# Patient Record
Sex: Female | Born: 1935 | Marital: Single | Smoking: Former smoker
Health system: Southern US, Community
[De-identification: ages and names within clinical notes are randomized; demographics above are authoritative.]

## PROBLEM LIST (undated history)

## (undated) DIAGNOSIS — E119 Type 2 diabetes mellitus without complications: Secondary | ICD-10-CM

## (undated) DIAGNOSIS — F0280 Dementia in other diseases classified elsewhere without behavioral disturbance: Secondary | ICD-10-CM

## (undated) DIAGNOSIS — E079 Disorder of thyroid, unspecified: Secondary | ICD-10-CM

## (undated) DIAGNOSIS — E538 Deficiency of other specified B group vitamins: Secondary | ICD-10-CM

## (undated) DIAGNOSIS — I10 Essential (primary) hypertension: Secondary | ICD-10-CM

## (undated) DIAGNOSIS — C50919 Malignant neoplasm of unspecified site of unspecified female breast: Secondary | ICD-10-CM

## (undated) DIAGNOSIS — G4700 Insomnia, unspecified: Secondary | ICD-10-CM

## (undated) DIAGNOSIS — E46 Unspecified protein-calorie malnutrition: Secondary | ICD-10-CM

## (undated) DIAGNOSIS — E785 Hyperlipidemia, unspecified: Secondary | ICD-10-CM

## (undated) DIAGNOSIS — G609 Hereditary and idiopathic neuropathy, unspecified: Secondary | ICD-10-CM

## (undated) HISTORY — PX: APPENDECTOMY: SHX54

## (undated) HISTORY — DX: Hyperlipidemia, unspecified: E78.5

## (undated) HISTORY — PX: MASTECTOMY: SHX3

## (undated) HISTORY — DX: Type 2 diabetes mellitus without complications: E11.9

## (undated) HISTORY — DX: Dementia in other diseases classified elsewhere, unspecified severity, without behavioral disturbance, psychotic disturbance, mood disturbance, and anxiety: F02.80

## (undated) HISTORY — DX: Essential (primary) hypertension: I10

## (undated) HISTORY — PX: OTHER SURGICAL HISTORY: SHX169

## (undated) HISTORY — DX: Unspecified protein-calorie malnutrition: E46

## (undated) HISTORY — DX: Hereditary and idiopathic neuropathy, unspecified: G60.9

## (undated) HISTORY — DX: Malignant neoplasm of unspecified site of unspecified female breast: C50.919

## (undated) HISTORY — PX: BREAST SURGERY: SHX581

## (undated) HISTORY — PX: TONSILLECTOMY: SUR1361

## (undated) HISTORY — DX: Deficiency of other specified B group vitamins: E53.8

## (undated) HISTORY — DX: Disorder of thyroid, unspecified: E07.9

## (undated) HISTORY — DX: Insomnia, unspecified: G47.00

---

## 2002-10-26 DIAGNOSIS — C50919 Malignant neoplasm of unspecified site of unspecified female breast: Secondary | ICD-10-CM | POA: Insufficient documentation

## 2002-10-26 HISTORY — DX: Malignant neoplasm of unspecified site of unspecified female breast: C50.919

## 2004-04-28 ENCOUNTER — Ambulatory Visit: Payer: Self-pay | Admitting: Internal Medicine

## 2004-05-27 ENCOUNTER — Ambulatory Visit: Payer: Self-pay | Admitting: Internal Medicine

## 2004-09-27 ENCOUNTER — Ambulatory Visit: Payer: Self-pay | Admitting: General Surgery

## 2005-04-27 ENCOUNTER — Ambulatory Visit: Payer: Self-pay | Admitting: Internal Medicine

## 2005-05-27 ENCOUNTER — Ambulatory Visit: Payer: Self-pay | Admitting: Internal Medicine

## 2005-07-26 ENCOUNTER — Ambulatory Visit: Payer: Self-pay | Admitting: Internal Medicine

## 2005-07-28 ENCOUNTER — Ambulatory Visit: Payer: Self-pay | Admitting: Internal Medicine

## 2005-10-28 ENCOUNTER — Ambulatory Visit: Payer: Self-pay | Admitting: General Surgery

## 2006-11-27 ENCOUNTER — Ambulatory Visit: Payer: Self-pay | Admitting: General Surgery

## 2007-11-26 ENCOUNTER — Ambulatory Visit: Payer: Self-pay | Admitting: Internal Medicine

## 2007-11-26 ENCOUNTER — Ambulatory Visit: Payer: Self-pay | Admitting: General Surgery

## 2007-12-26 ENCOUNTER — Ambulatory Visit: Payer: Self-pay | Admitting: Internal Medicine

## 2008-01-10 ENCOUNTER — Ambulatory Visit: Payer: Self-pay | Admitting: Internal Medicine

## 2008-01-26 ENCOUNTER — Ambulatory Visit: Payer: Self-pay | Admitting: Internal Medicine

## 2008-02-26 ENCOUNTER — Ambulatory Visit: Payer: Self-pay | Admitting: Internal Medicine

## 2008-03-27 ENCOUNTER — Ambulatory Visit: Payer: Self-pay | Admitting: Internal Medicine

## 2008-04-27 ENCOUNTER — Ambulatory Visit: Payer: Self-pay | Admitting: Internal Medicine

## 2008-05-27 ENCOUNTER — Ambulatory Visit: Payer: Self-pay | Admitting: Internal Medicine

## 2008-06-27 ENCOUNTER — Ambulatory Visit: Payer: Self-pay | Admitting: Internal Medicine

## 2008-07-01 ENCOUNTER — Ambulatory Visit: Payer: Self-pay | Admitting: Internal Medicine

## 2008-07-28 ENCOUNTER — Ambulatory Visit: Payer: Self-pay | Admitting: Internal Medicine

## 2008-08-25 ENCOUNTER — Ambulatory Visit: Payer: Self-pay | Admitting: Internal Medicine

## 2008-09-20 IMAGING — CT CT GUIDANCE PLACEMENT RAD THERAPY FIELDS
1 series · 16 of 32 positions shown, 20 images · non-contrast
Comparison: none

[Series 2: tx planning · axial · 0.98mm/px · z∈[-152,+162]mm · 16 of 141 slices shown, 20 images]
[im 10/141  soft-tissue]
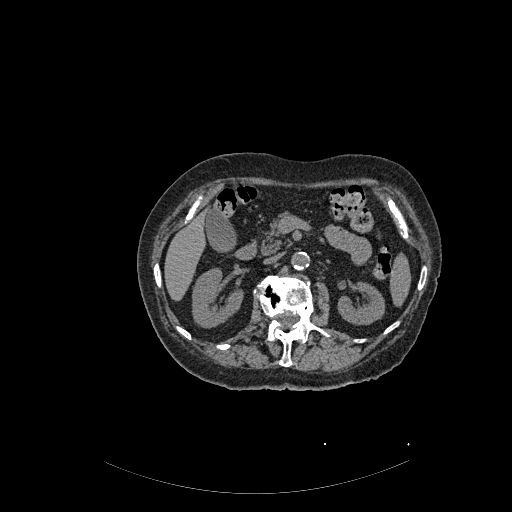
[im 10/141  bone]
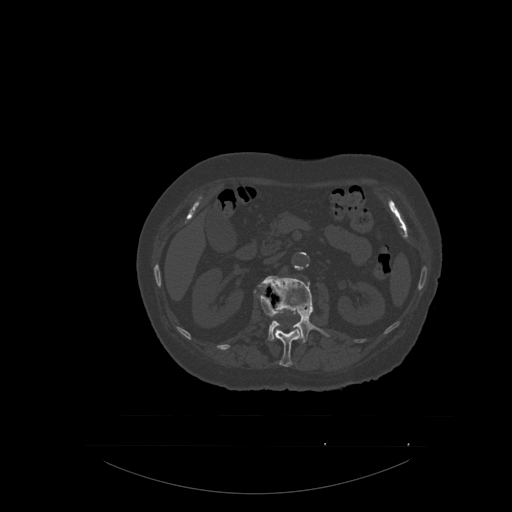
[im 19/141  soft-tissue]
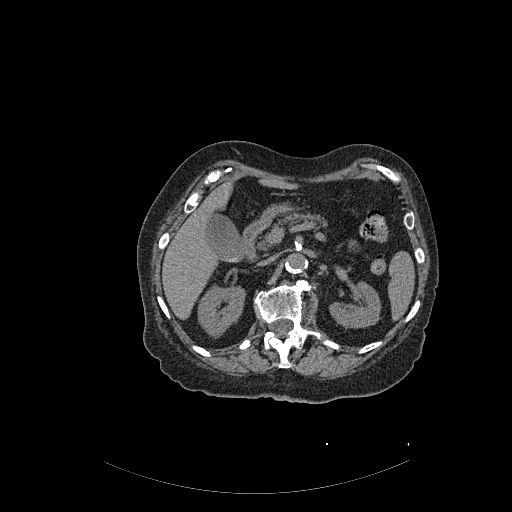
[im 28/141  soft-tissue]
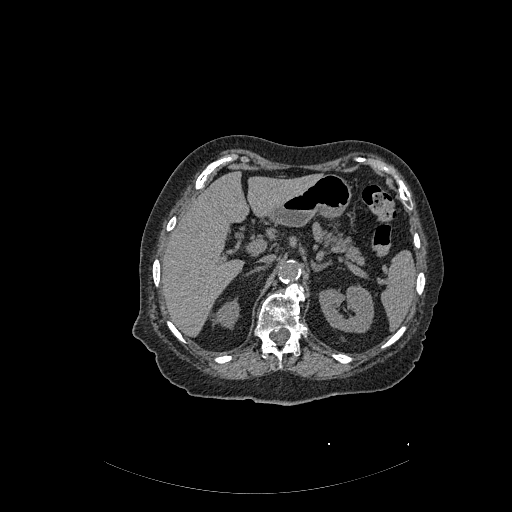
[im 37/141  soft-tissue]
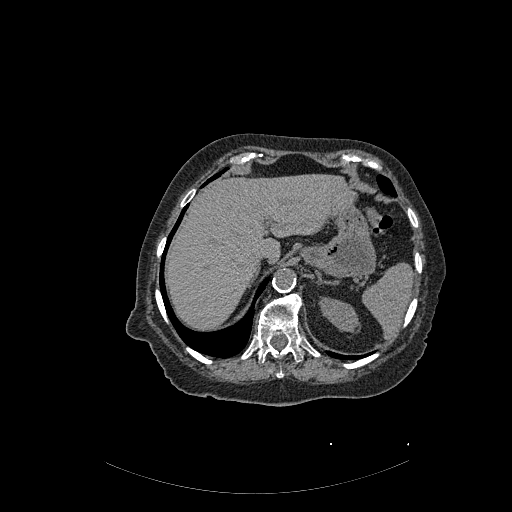
[im 46/141  soft-tissue]
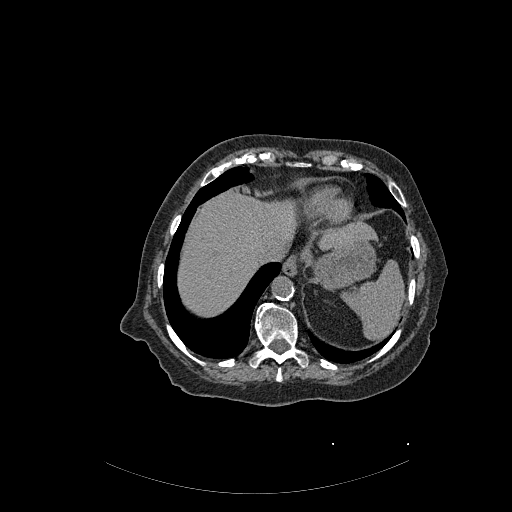
[im 55/141  soft-tissue]
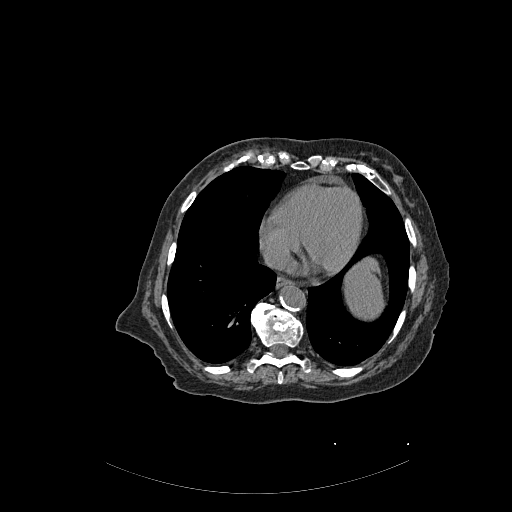
[im 64/141  soft-tissue]
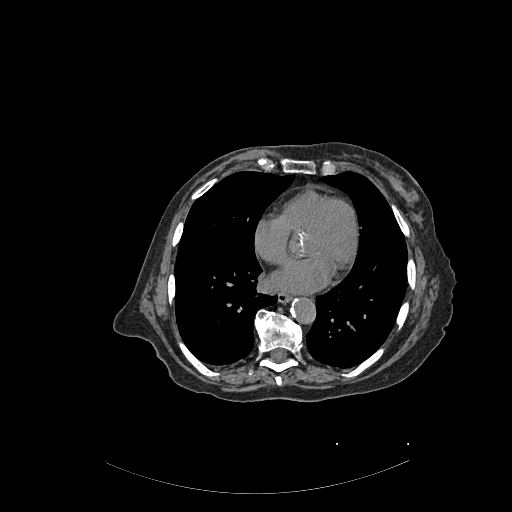
[im 77/141  soft-tissue]
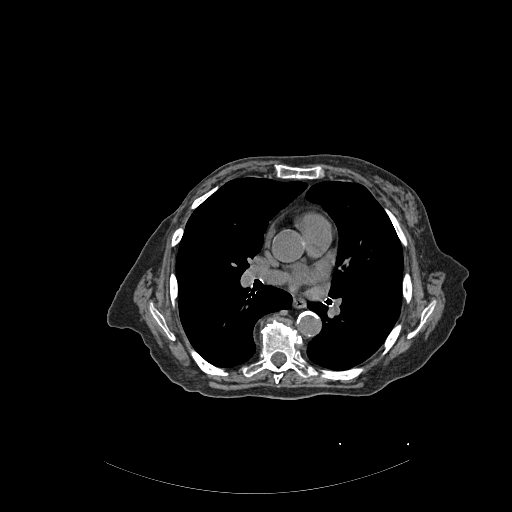
[im 86/141  soft-tissue]
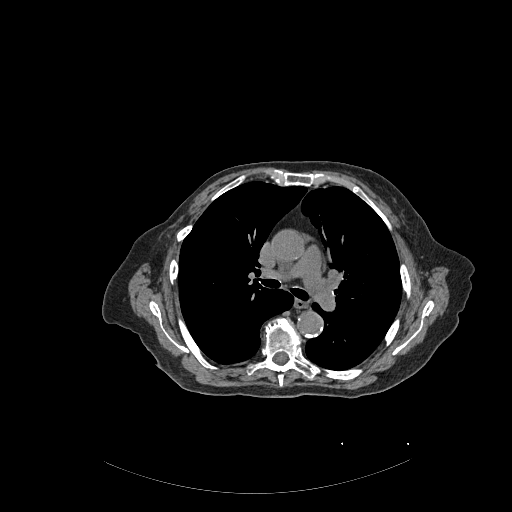
[im 86/141  bone]
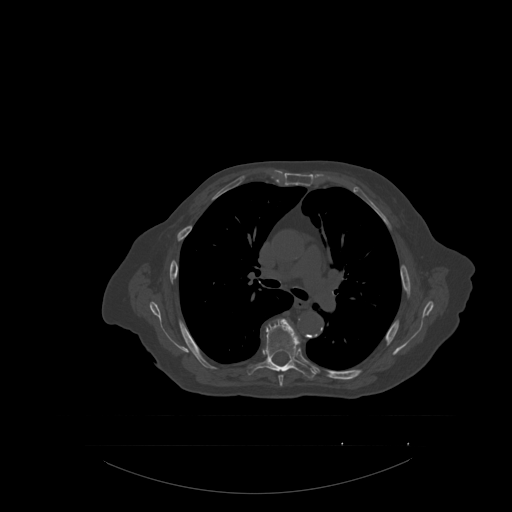
[im 95/141  soft-tissue]
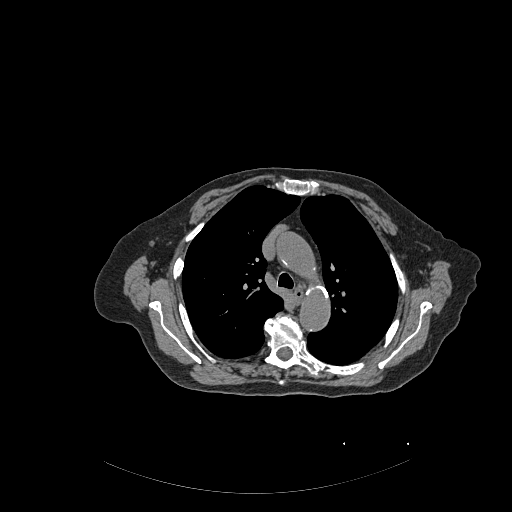
[im 104/141  soft-tissue]
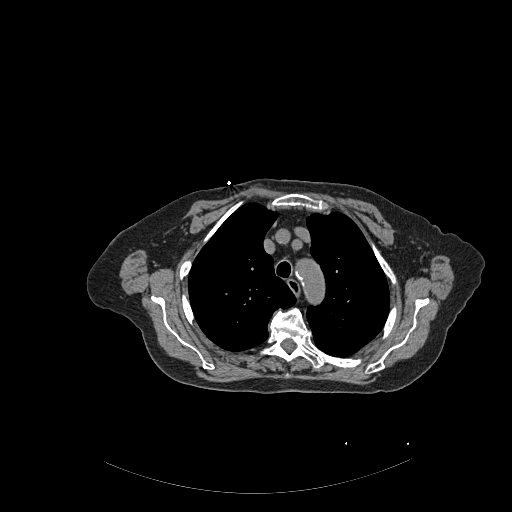
[im 113/141  soft-tissue]
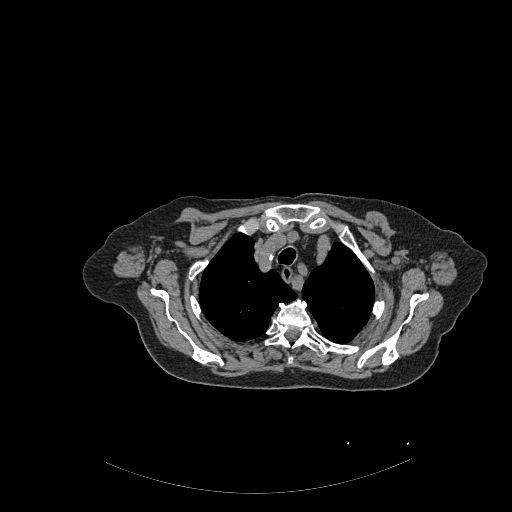
[im 122/141  soft-tissue]
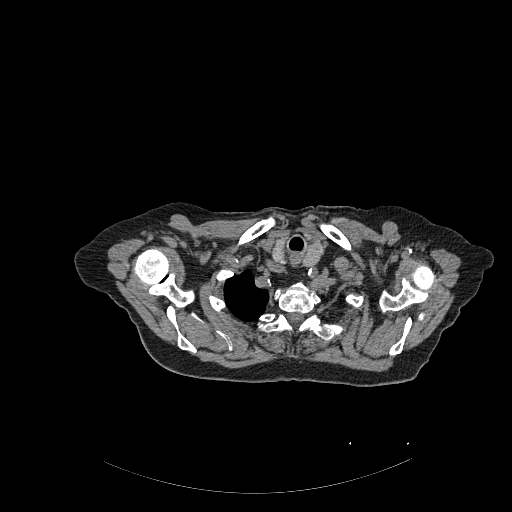
[im 122/141  lung]
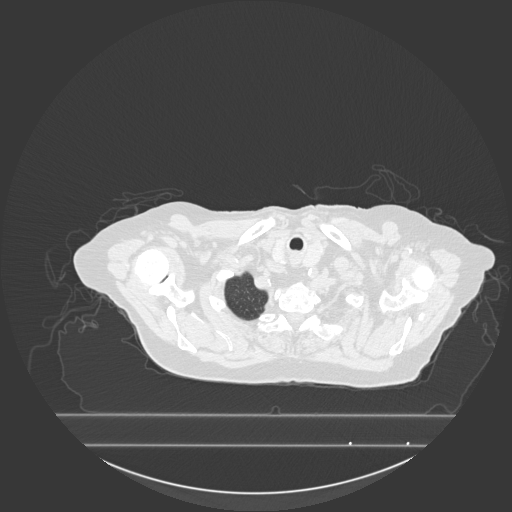
[im 127/141  lung]
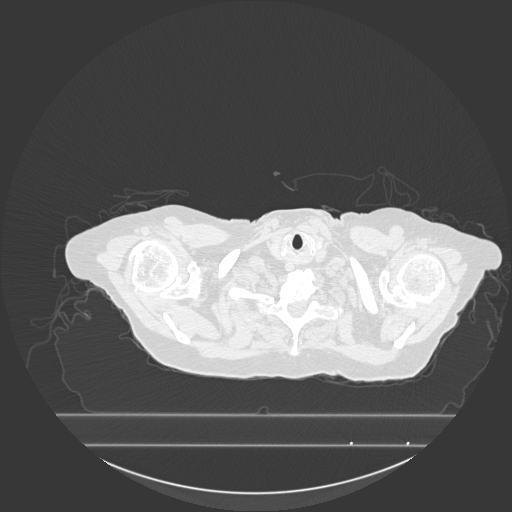
[im 131/141  soft-tissue]
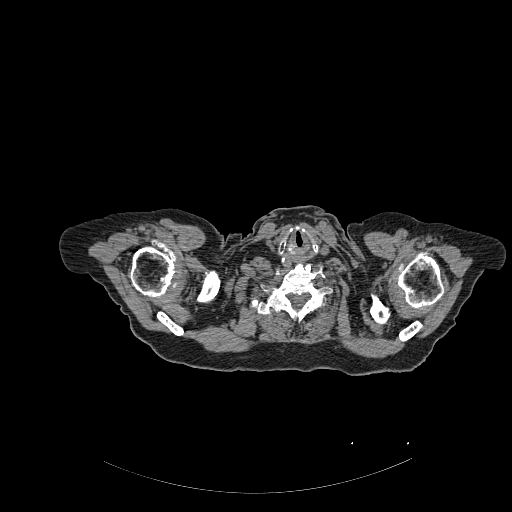
[im 131/141  lung]
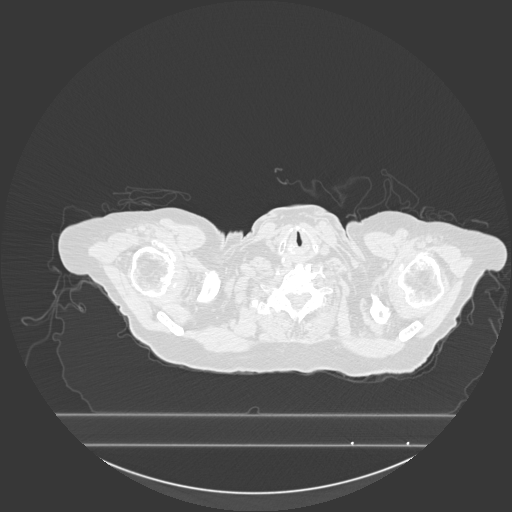
[im 136/141  lung]
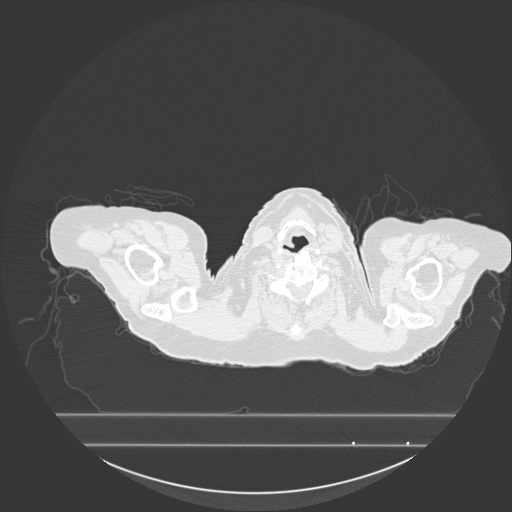

[16 of 32 positions shown; findings below may reference images not displayed]

IMAGES IMPORTED FROM THE SYNGO WORKFLOW SYSTEM
NO DICTATION FOR STUDY

## 2008-09-25 ENCOUNTER — Ambulatory Visit: Payer: Self-pay | Admitting: Internal Medicine

## 2008-10-06 ENCOUNTER — Ambulatory Visit: Payer: Self-pay | Admitting: Internal Medicine

## 2008-10-25 ENCOUNTER — Ambulatory Visit: Payer: Self-pay | Admitting: Internal Medicine

## 2008-11-25 ENCOUNTER — Ambulatory Visit: Payer: Self-pay | Admitting: Internal Medicine

## 2008-12-22 ENCOUNTER — Ambulatory Visit: Payer: Self-pay | Admitting: Internal Medicine

## 2008-12-22 ENCOUNTER — Ambulatory Visit: Payer: Self-pay | Admitting: General Surgery

## 2008-12-25 ENCOUNTER — Ambulatory Visit: Payer: Self-pay | Admitting: Internal Medicine

## 2009-12-29 ENCOUNTER — Ambulatory Visit: Payer: Self-pay | Admitting: General Surgery

## 2011-07-12 ENCOUNTER — Encounter: Payer: Self-pay | Admitting: Nurse Practitioner

## 2011-07-12 ENCOUNTER — Encounter: Payer: Self-pay | Admitting: Cardiothoracic Surgery

## 2011-07-15 ENCOUNTER — Encounter: Payer: Self-pay | Admitting: Internal Medicine

## 2011-07-15 ENCOUNTER — Ambulatory Visit: Payer: Self-pay | Admitting: Internal Medicine

## 2011-07-15 ENCOUNTER — Ambulatory Visit (INDEPENDENT_AMBULATORY_CARE_PROVIDER_SITE_OTHER): Payer: 59 | Admitting: Internal Medicine

## 2011-07-15 VITALS — BP 100/50 | HR 92 | Temp 97.4°F | Wt 100.0 lb

## 2011-07-15 DIAGNOSIS — E039 Hypothyroidism, unspecified: Secondary | ICD-10-CM

## 2011-07-15 DIAGNOSIS — E559 Vitamin D deficiency, unspecified: Secondary | ICD-10-CM

## 2011-07-15 DIAGNOSIS — R634 Abnormal weight loss: Secondary | ICD-10-CM | POA: Insufficient documentation

## 2011-07-15 DIAGNOSIS — E119 Type 2 diabetes mellitus without complications: Secondary | ICD-10-CM

## 2011-07-15 DIAGNOSIS — F0390 Unspecified dementia without behavioral disturbance: Secondary | ICD-10-CM | POA: Insufficient documentation

## 2011-07-15 DIAGNOSIS — E46 Unspecified protein-calorie malnutrition: Secondary | ICD-10-CM

## 2011-07-15 DIAGNOSIS — N390 Urinary tract infection, site not specified: Secondary | ICD-10-CM

## 2011-07-15 LAB — POCT URINALYSIS DIPSTICK
Glucose, UA: NEGATIVE
Nitrite, UA: POSITIVE
Spec Grav, UA: 1.015
Urobilinogen, UA: 1

## 2011-07-15 NOTE — Assessment & Plan Note (Addendum)
By UA today, but has been taking keflex since W[place]day for wound infection.  Will send for culture and if resistant to keflex we will call an alternative abx.

## 2011-07-15 NOTE — Progress Notes (Signed)
Subjective:    Patient ID: Sophia Rice, female    DOB: 11-06-1923, 78 y.o.   MRN: [id number]  HPI  Ms. Camposano is an 79 yo white female with a history of diet controlled diabetes, breast cancer diagnosed and treated in 2004  And mild dementia who is brought in by her nephew for her annual visit.  Patient refuses quarterly care and lives alone with family nearby providing meals.  Her chief complaint is weight loss.  She does not prepare her own meals,  States that her appetite is "so so", and per family has not been bathing or etaing ulness family stays with her.  She is a recluse and prefers to be left alone.  She denies pain, shortness of breath and trouble sleeping.    Past Medical History  Diagnosis Date  . Hypertension   . Diabetes mellitus type II   . Breast cancer 10/2002    right. Recurrence treated with XRT 2009  . Hyperlipidemia   . Unspecified hereditary and idiopathic peripheral neuropathy   . Dementia in conditions classified elsewhere without behavioral disturbance   . Persistent insomnia   . Other B-complex deficiencies   . Other protein-calorie malnutrition     No current outpatient prescriptions on file prior to visit.     Review of Systems  Constitutional: Negative for fever, chills and unexpected weight change.  HENT: Negative for hearing loss, ear pain, nosebleeds, congestion, sore throat, facial swelling, rhinorrhea, sneezing, mouth sores, trouble swallowing, neck pain, neck stiffness, voice change, postnasal drip, sinus pressure, tinnitus and ear discharge.   Eyes: Negative for pain, discharge, redness and visual disturbance.  Respiratory: Negative for cough, chest tightness, shortness of breath, wheezing and stridor.   Cardiovascular: Negative for chest pain, palpitations and leg swelling.  Musculoskeletal: Negative for myalgias and arthralgias.  Skin: Negative for color change and rash.  Neurological: Negative for dizziness, weakness, light-headedness  and headaches.  Hematological: Negative for adenopathy.   BP 100/50  Pulse 92  Temp(Src) 97.4 F (36.3 C) (Oral)  Wt 100 lb (45.36 kg)    Objective:   Physical Exam  Vitals reviewed. Constitutional: She is oriented to person, place, and time. She appears well-developed.       Unkempt, hair is dirty  HENT:  Mouth/Throat: Oropharynx is clear and moist.  Eyes: EOM are normal. Pupils are equal, round, and reactive to light. No scleral icterus.  Neck: Normal range of motion. Neck supple. No JVD present. No thyromegaly present.  Cardiovascular: Normal rate, regular rhythm, normal heart sounds and intact distal pulses.   Pulmonary/Chest: Effort normal and breath sounds normal.  Abdominal: Soft. Bowel sounds are normal. She exhibits no mass. There is no tenderness.  Musculoskeletal: Normal range of motion. She exhibits no edema.  Lymphadenopathy:    She has no cervical adenopathy.  Neurological: She is alert and oriented to person, place, and time. No cranial nerve deficit or sensory deficit. She displays a negative Romberg sign. Gait abnormal.       Gait is shuffling, short steps She cannot rise from chair without assistance  Skin: Skin is warm and dry.  Psychiatric: She has a normal mood and affect.       Assessment & Plan:   UTI (lower urinary tract infection) By UA today, but has been taking keflex since Wenesday for wound infection.  Will send for culture and if resistant to keflex we will call an alternative abx.   Dementia She maintains orientation to day and year but  has difficulyt with recall, calculations and abstract pictures (could not interpret or draw a clock face with the time 11:10).  She is not bathing and eating .  Her nephew has POA and we discussed the eventual, inevitable need for placement in assisted living [facility] care facility.  Family would like to keep her in her home if she will accept a paid caregiver coming in  twice weekly to help with bathing, and they  have been given the contact information for Meals on Wheels with Ensure /Boost supplements to be supplied by family .  Weight loss She has lost two lbs in the last six months by review of prior office's  records. Albumin is normal.  Prior work up for occult malignancy  in 2011 included a CT of chest, abdomen and pelvis which was normal.  She refused colonoscopy.  Wt loss has been attributed to dementia.  Recommended nutrtional supplements to family (Ensure, Boost, etc) once or two daily in addition to meals.  Her alk phos is elevated and may suggest cholelithiasis.  Will recommend abdominal ultrasound.   Diabetes mellitus, controlled Historically diet controlled. She has not had a hgba1c in over 6 months.  a1c was 6.0 today.  She could not provide enough urine for a , culture and  microalb/cr ratio so one was not done.   Hypothyroidism New diagnosis, with TSH > 20 today.  Starting low dose thyroid.  Repeat TSH needed in 6 weeks    Updated Medication List Outpatient Encounter Prescriptions as of 07/15/2011  Medication Sig Dispense Refill  . calcium-vitamin D (OSCAL WITH D) 500-200 MG-UNIT per tablet Take 1 tablet by mouth daily.      . cholecalciferol (VITAMIN D) 1000 UNITS tablet Take 1,000 Units by mouth daily.      Marland Kitchen zolpidem (AMBIEN) 5 MG tablet Take 5 mg by mouth at bedtime as needed.      . ergocalciferol (DRISDOL) 50000 UNITS capsule Take 1 capsule (50,000 Units total) by mouth once a week.  4 capsule  2  . levothyroxine (SYNTHROID, LEVOTHROID) 50 MCG tablet Take 1 tablet (50 mcg total) by mouth daily.  90 tablet  3

## 2011-07-15 NOTE — Patient Instructions (Addendum)
I want you to drink at least one Boost or Ensure can per day in addition to your meals so you don't lose any more weight .  You need to have a bath twice a week at least,  To keep clean . A nurse will be coming to the  House to help you with this.     You need to use walker every time you leave the house and inside the house.     You should never take your Life Alert off

## 2011-07-16 LAB — HEMOGLOBIN A1C
Hgb A1c MFr Bld: 6 % — ABNORMAL HIGH (ref <5.7)
Mean Plasma Glucose: 126 mg/dL — ABNORMAL HIGH (ref <117)

## 2011-07-16 LAB — VITAMIN D 25 HYDROXY (VIT D DEFICIENCY, FRACTURES): Vit D, 25-Hydroxy: 19 ng/mL — ABNORMAL LOW (ref 30–89)

## 2011-07-16 LAB — COMPREHENSIVE METABOLIC PANEL
ALT: 10 U/L (ref 0–35)
AST: 31 U/L (ref 0–37)
Albumin: 3.7 g/dL (ref 3.5–5.2)
Alkaline Phosphatase: 206 U/L — ABNORMAL HIGH (ref 39–117)
BUN: 18 mg/dL (ref 6–23)
CO2: 20 mEq/L (ref 19–32)
Calcium: 9 mg/dL (ref 8.4–10.5)
Chloride: 97 mEq/L (ref 96–112)
Creat: 0.7 mg/dL (ref 0.50–1.10)
Glucose, Bld: 106 mg/dL — ABNORMAL HIGH (ref 70–99)
Potassium: 4.4 mEq/L (ref 3.5–5.3)
Sodium: 134 mEq/L — ABNORMAL LOW (ref 135–145)
Total Bilirubin: 0.7 mg/dL (ref 0.3–1.2)
Total Protein: 5.4 g/dL — ABNORMAL LOW (ref 6.0–8.3)

## 2011-07-16 LAB — TSH: TSH: 27.019 u[IU]/mL — ABNORMAL HIGH (ref 0.350–4.500)

## 2011-07-16 MED ORDER — LEVOTHYROXINE SODIUM 50 MCG PO TABS: 50.0000 ug | ORAL_TABLET | Freq: Every day | ORAL | Status: AC

## 2011-07-16 MED ORDER — ERGOCALCIFEROL 1.25 MG (50000 UT) PO CAPS: 50000.0000 [IU] | ORAL_CAPSULE | ORAL | Status: AC

## 2011-07-17 ENCOUNTER — Encounter: Payer: Self-pay | Admitting: Internal Medicine

## 2011-07-17 DIAGNOSIS — E1143 Type 2 diabetes mellitus with diabetic autonomic (poly)neuropathy: Secondary | ICD-10-CM | POA: Insufficient documentation

## 2011-07-17 DIAGNOSIS — E039 Hypothyroidism, unspecified: Secondary | ICD-10-CM | POA: Insufficient documentation

## 2011-07-17 NOTE — Assessment & Plan Note (Addendum)
She has lost two lbs in the last six months by review of prior office's  records. Albumin is normal.  Prior work up for occult malignancy  in 2011 included a CT of chest, abdomen and pelvis which was normal.  She refused colonoscopy.  Wt loss has been attributed to dementia.  Recommended nutrtional supplements to family (Ensure, Boost, etc) once or two daily in addition to meals.  Her alk phos is elevated and may suggest cholelithiasis.  Will recommend abdominal ultrasound.

## 2011-07-17 NOTE — Assessment & Plan Note (Signed)
New diagnosis, with TSH > 20 today.  Starting low dose thyroid.  Repeat TSH needed in 6 weeks

## 2011-07-17 NOTE — Assessment & Plan Note (Signed)
She maintains orientation to day and year but has difficulyt with recall, calculations and abstract pictures (could not interpret or draw a clock face with the time 11:10).  She is not bathing and eating .  Her nephew has POA and we discussed the eventual, inevitable need for placement in assisted living [facility] care facility.  Family would like to keep her in her home if she will accept a paid caregiver coming in  twice weekly to help with bathing, and they have been given the contact information for Meals on Wheels with Ensure /Boost supplements to be supplied by family .

## 2011-07-17 NOTE — Assessment & Plan Note (Addendum)
Historically diet controlled. She has not had a hgba1c in over 6 months.  a1c was 6.0 today.  She could not provide enough urine for a , culture and  microalb/cr ratio so one was not done.

## 2011-07-18 LAB — MICROALBUMIN / CREATININE URINE RATIO

## 2011-07-18 LAB — URINE CULTURE: Colony Count: >=100000

## 2011-07-21 ENCOUNTER — Encounter: Payer: Self-pay | Admitting: Internal Medicine

## 2011-07-22 NOTE — Telephone Encounter (Signed)
Pt's niece advised of lab results, meds called to [facility].

## 2011-07-24 ENCOUNTER — Encounter: Payer: Self-pay | Admitting: Internal Medicine

## 2011-07-25 ENCOUNTER — Telehealth: Payer: Self-pay | Admitting: *Deleted

## 2011-07-25 ENCOUNTER — Ambulatory Visit: Payer: Self-pay | Admitting: Internal Medicine

## 2011-07-25 NOTE — Telephone Encounter (Signed)
would you please give me a call with the results of [facility] lab results; I still do not see them on line, and let me know also if you have called any new prescriptions. Under medical record, it looked as if a prescription had been prescribed, i.e. Synthroid but [facility] did not have record of this being done (your file has dated 07/16/11). You can call and talk to myself or Randa Ngo @ [phone].   Thanks, Lowella Petties. Dan Humphreys, Medical Power of Attorney    This has been taken care of. See Lab results.

## 2011-07-25 NOTE — Telephone Encounter (Signed)
Called abd ultrasound report- multiple pancreatic and liver masses, gallstones.  Radiologist questions if mets from past breast cancer.

## 2011-07-25 NOTE — Telephone Encounter (Signed)
Can you please get the actual report for review and set pt up time to discuss?

## 2011-07-26 NOTE — Telephone Encounter (Signed)
Dr. Dan Humphreys has reviewed results, appt made to discuss on Friday with Dr. Darrick Huntsman.

## 2011-07-29 ENCOUNTER — Encounter: Payer: Self-pay | Admitting: Internal Medicine

## 2011-07-29 ENCOUNTER — Ambulatory Visit (INDEPENDENT_AMBULATORY_CARE_PROVIDER_SITE_OTHER): Payer: 59 | Admitting: Internal Medicine

## 2011-07-29 ENCOUNTER — Ambulatory Visit: Payer: Self-pay | Admitting: Internal Medicine

## 2011-07-29 DIAGNOSIS — E039 Hypothyroidism, unspecified: Secondary | ICD-10-CM

## 2011-07-29 DIAGNOSIS — E1169 Type 2 diabetes mellitus with other specified complication: Secondary | ICD-10-CM

## 2011-07-29 DIAGNOSIS — C801 Malignant (primary) neoplasm, unspecified: Secondary | ICD-10-CM

## 2011-07-29 DIAGNOSIS — C799 Secondary malignant neoplasm of unspecified site: Secondary | ICD-10-CM | POA: Insufficient documentation

## 2011-07-29 DIAGNOSIS — E119 Type 2 diabetes mellitus without complications: Secondary | ICD-10-CM

## 2011-07-29 DIAGNOSIS — I10 Essential (primary) hypertension: Secondary | ICD-10-CM | POA: Insufficient documentation

## 2011-07-29 DIAGNOSIS — L97509 Non-pressure chronic ulcer of other part of unspecified foot with unspecified severity: Secondary | ICD-10-CM

## 2011-07-29 DIAGNOSIS — E079 Disorder of thyroid, unspecified: Secondary | ICD-10-CM | POA: Insufficient documentation

## 2011-07-29 DIAGNOSIS — F0390 Unspecified dementia without behavioral disturbance: Secondary | ICD-10-CM

## 2011-07-29 DIAGNOSIS — E11621 Type 2 diabetes mellitus with foot ulcer: Secondary | ICD-10-CM

## 2011-07-29 NOTE — Assessment & Plan Note (Addendum)
Suggested by abdominal ultrasound with multiple masses seen in the pancreas and liver.  Patient has a history of breast cancer, treated with mastectomy in 2004 and recurrence treated with XRT in 2009, and had  normal chest CT in 2010 which was done for similar complaints of weight loss. So the etiology of her new pancreatic and liver masses are unclear.  I have dicussed the potential diagnoses with patient, who has dementia, and her nephew who has healthcare POA.  We have agreed to pursue CT of chest, abdomen and pelvis to confirm the spread and scope of the malignancy and will refer to [facility] the [facility] for followup next week.  The patient does not want treatment for cancer but does not seem to understand that 6 months from now she may develop obstructive jaundice and may benefit from palliative  radiation. Her nephew understands this possibility and wants whatever is best for his aunt.

## 2011-07-29 NOTE — Assessment & Plan Note (Addendum)
Her diabetes is well Controlled with diet alone. hgba1c was 6.0 , however she has significant peripheral neuropathy and a diabetic foot ulcer on the first metatarsal head of her right foot that is under management by the wound care clinic. She has not been placed in and a offloading boot because of her solitary living situation and her continued nonuse of a walker.

## 2011-07-29 NOTE — Assessment & Plan Note (Signed)
Her nephew sees a continued decline in cognitive function as evidenced to her inability to take her medications and eat consistently unless supervised. We discussed transitioning her to an assisted living facility for patients with cognitive deficits/dementia. However given her likely diagnosis of metastatic cancer it may be more appropriate to move her to nursing home versus hospice home we decided to hold off on any changes until the oncology evaluation has taken place.

## 2011-07-29 NOTE — Assessment & Plan Note (Signed)
Her thyroid is very underactive despite supplementation likely due to medication management. She continues to live alone and the family is doing the best that they can but cannot be with her on a daily basis to maintain medications. We will consider moving to assisted living arrangement pending evaluation of new diagnosis of probable metastatic cancer.

## 2011-07-29 NOTE — Progress Notes (Signed)
Subjective:    Patient ID: Sophia Rice, female    DOB: 23-Jan-1924, 78 y.o.   MRN: [id number]  HPI  Ms. Willilamson returns with her nephew  To discuss the results of her abdominal ultrasound, which was done due to history of weight loss and elevated alkaline phosphatase by recent testing. She continues to deny pain and nausea but is not eating despite family's provision of Ensure and other groceries and nutritional supplements.  [facility],  btu records are not available at time of dictation.   Past Medical History  Diagnosis Date  . Diabetes mellitus type II   . Hyperlipidemia   . Unspecified hereditary and idiopathic peripheral neuropathy   . Dementia in conditions classified elsewhere without behavioral disturbance   . Persistent insomnia   . Other B-complex deficiencies   . Other protein-calorie malnutrition   . Thyroid disease   . Hypertension   . Breast cancer 10/2002    right. Recurrence treated with XRT 2009   Current Outpatient Prescriptions on File Prior to Visit  Medication Sig Dispense Refill  . calcium-vitamin D (OSCAL WITH D) 500-200 MG-UNIT per tablet Take 1 tablet by mouth daily.      . cholecalciferol (VITAMIN D) 1000 UNITS tablet Take 1,000 Units by mouth daily.      . ergocalciferol (DRISDOL) 50000 UNITS capsule Take 1 capsule (50,000 Units total) by mouth once a week.  4 capsule  2  . levothyroxine (SYNTHROID, LEVOTHROID) 50 MCG tablet Take 1 tablet (50 mcg total) by mouth daily.  90 tablet  3  . zolpidem (AMBIEN) 5 MG tablet Take 5 mg by mouth at bedtime as needed.          Review of Systems  Constitutional: Positive for unexpected weight change. Negative for fever and chills.  HENT: Negative for hearing loss, ear pain, nosebleeds, congestion, sore throat, facial swelling, rhinorrhea, sneezing, mouth sores, trouble swallowing, neck pain, neck stiffness, voice change, postnasal drip, sinus  pressure, tinnitus and ear discharge.   Eyes: Negative for pain, discharge, redness and visual disturbance.  Respiratory: Negative for cough, chest tightness, shortness of breath, wheezing and stridor.   Cardiovascular: Negative for chest pain, palpitations and leg swelling.  Musculoskeletal: Negative for myalgias and arthralgias.  Skin: Negative for color change and rash.  Neurological: Negative for dizziness, weakness, light-headedness and headaches.  Hematological: Negative for adenopathy.  Psychiatric/Behavioral: Positive for sleep disturbance and decreased concentration.   BP 112/52  Pulse 84  Temp(Src) 97.7 F (36.5 C) (Oral)     Objective:   Physical Exam  Vitals reviewed. Constitutional: She appears well-developed.       Unkempt, hair is dirty  HENT:  Mouth/Throat: Oropharynx is clear and moist.  Eyes: EOM are normal. Pupils are equal, round, and reactive to light. No scleral icterus.  Neck: Normal range of motion. Neck supple. No JVD present. No thyromegaly present.  Cardiovascular: Normal rate, regular rhythm, normal heart sounds and intact distal pulses.   Pulmonary/Chest: Effort normal and breath sounds normal.  Abdominal: Soft. Bowel sounds are normal. She exhibits no mass. There is no tenderness.  Musculoskeletal: Normal range of motion. She exhibits no edema.  Lymphadenopathy:    She has no cervical adenopathy.  Neurological: She is alert. No cranial nerve deficit or sensory deficit. She displays a negative Romberg sign. Gait abnormal.       Gait is shuffling, short steps She cannot rise from chair without assistance  Skin: Skin is warm and dry.  Psychiatric: She has a normal mood and affect.       Assessment & Plan:   Metastatic cancer Suggested by abdominal ultrasound with multiple masses seen in the pancreas and liver.  Patient has a history of breast cancer, treated with mastectomy in 2004 and recurrence treated with XRT in 2009, and had  normal chest CT  in 2010 which was done for similar complaints of weight loss. So the etiology of her new pancreatic and liver masses are unclear.  I have dicussed the potential diagnoses with patient, who has dementia, and her nephew who has healthcare POA.  We have agreed to pursue CT of chest, abdomen and pelvis to confirm the spread and scope of the malignancy and will refer to [facility] of the [facility] for followup next week.  The patient does not want treatment for cancer but does not seem to understand that 6 months from now she may develop obstructive jaundice and may benefit from palliative  radiation. Her nephew understands this possibility and wants whatever is best for his aunt.  Hypothyroidism Her thyroid is very underactive despite supplementation likely due to medication management. She continues to live alone and the family is doing the best that they can but cannot be with her on a daily basis to maintain medications. We will consider moving to assisted living arrangement pending evaluation of new diagnosis of probable metastatic cancer.  Diabetes mellitus, controlled Controlled with diet alone. hgba1c was 6.0   Dementia Her nephew sees a continued decline in cognitive function as evidenced to her inability to take her medications and eat consistently unless supervised. We discussed transitioning her to an assisted living facility for patients with cognitive deficits/dementia. However given her likely diagnosis of metastatic cancer it may be more appropriate to move her to nursing home versus hospice home we decided to hold off on any changes until the oncology evaluation has taken place.    Updated Medication List Outpatient Encounter Prescriptions as of 07/29/2011  Medication Sig Dispense Refill  . calcium-vitamin D (OSCAL WITH D) 500-200 MG-UNIT per tablet Take 1 tablet by mouth daily.      . cholecalciferol (VITAMIN D) 1000 UNITS tablet Take 1,000 Units by mouth daily.       . ergocalciferol (DRISDOL) 50000 UNITS capsule Take 1 capsule (50,000 Units total) by mouth once a week.  4 capsule  2  . levothyroxine (SYNTHROID, LEVOTHROID) 50 MCG tablet Take 1 tablet (50 mcg total) by mouth daily.  90 tablet  3  . zolpidem (AMBIEN) 5 MG tablet Take 5 mg by mouth at bedtime as needed.

## 2011-07-31 ENCOUNTER — Ambulatory Visit: Payer: Self-pay | Admitting: Internal Medicine

## 2011-07-31 ENCOUNTER — Inpatient Hospital Stay: Payer: Self-pay | Admitting: Internal Medicine

## 2011-07-31 DIAGNOSIS — E11621 Type 2 diabetes mellitus with foot ulcer: Secondary | ICD-10-CM | POA: Insufficient documentation

## 2011-07-31 LAB — URINALYSIS, COMPLETE
Bilirubin,UR: NEGATIVE
Blood: NEGATIVE
Glucose,UR: NEGATIVE mg/dL
Ketone: NEGATIVE
Nitrite: NEGATIVE
Ph: 8
Protein: NEGATIVE
RBC,UR: 6 /HPF
Specific Gravity: 1.017
Squamous Epithelial: <1
WBC UR: 323 /HPF

## 2011-07-31 LAB — CBC WITH DIFFERENTIAL/PLATELET
Eosinophil %: 0.2 %
HGB: 12.5 g/dL (ref 12.0–16.0)
Lymphocyte #: 1.1 10*3/uL (ref 1.0–3.6)
Monocyte #: 1.2 10*3/uL — ABNORMAL HIGH (ref 0.0–0.7)
Platelet: 191 10*3/uL (ref 150–440)
RBC: 3.79 10*6/uL — ABNORMAL LOW (ref 3.80–5.20)
RDW: 16.1 % — ABNORMAL HIGH (ref 11.5–14.5)

## 2011-07-31 LAB — COMPREHENSIVE METABOLIC PANEL
Alkaline Phosphatase: 229 U/L — ABNORMAL HIGH (ref 50–136)
Calcium, Total: 8.2 mg/dL — ABNORMAL LOW (ref 8.5–10.1)
Chloride: 107 mmol/L (ref 98–107)
Co2: 29 mmol/L (ref 21–32)
EGFR (African American): >60
EGFR (Non-African Amer.): >60
SGOT(AST): 214 U/L — ABNORMAL HIGH (ref 15–37)
SGPT (ALT): 50 U/L
Sodium: 146 mmol/L — ABNORMAL HIGH (ref 136–145)

## 2011-07-31 LAB — TSH: Thyroid Stimulating Horm: 5.05 u[IU]/mL — ABNORMAL HIGH

## 2011-07-31 NOTE — Assessment & Plan Note (Signed)
First metatarsal head dictated by severe peripheral neuropathy , poor nutritional status, dementia, and solitary lifestyle. Wound is being managed by wound care center. He may have intentionally not put her in an offloading boot due to her increased risk of falls. She was prescribed Keflex on February 1 for management of superficial infection. The wound today does have a slight odor to it but the dressings are somewhat dirty she does have a home health coming out to change the dressings. There is little chance that this wound will heal given her current living situation

## 2011-08-01 ENCOUNTER — Telehealth: Payer: Self-pay | Admitting: Internal Medicine

## 2011-08-01 NOTE — Telephone Encounter (Signed)
pllase cancel the outpatient CTS,  They were done in house.

## 2011-08-02 LAB — CBC WITH DIFFERENTIAL/PLATELET
Eosinophil #: 0 10*3/uL (ref 0.0–0.7)
Eosinophil %: 0.4 %
HCT: 38.8 % (ref 35.0–47.0)
Lymphocyte #: 1.3 10*3/uL (ref 1.0–3.6)
MCV: 99 fL (ref 80–100)
Monocyte %: 9.7 %
Neutrophil #: 7.9 10*3/uL — ABNORMAL HIGH (ref 1.4–6.5)
Neutrophil %: 76.8 %
RBC: 3.94 10*6/uL (ref 3.80–5.20)
WBC: 10.3 10*3/uL (ref 3.6–11.0)

## 2011-08-02 LAB — BASIC METABOLIC PANEL
Anion Gap: 14 (ref 7–16)
BUN: 16 mg/dL (ref 7–18)
Creatinine: 0.52 mg/dL — ABNORMAL LOW (ref 0.60–1.30)
EGFR (African American): >60
Osmolality: 284 (ref 275–301)
Sodium: 141 mmol/L (ref 136–145)

## 2011-08-02 LAB — URINE CULTURE

## 2011-08-04 LAB — CBC WITH DIFFERENTIAL/PLATELET
Eosinophil #: 0 10*3/uL (ref 0.0–0.7)
Eosinophil %: 0.2 %
HCT: 37.1 % (ref 35.0–47.0)
HGB: 12.3 g/dL (ref 12.0–16.0)
Lymphocyte %: 16.6 %
MCH: 32.8 pg (ref 26.0–34.0)
MCHC: 33.1 g/dL (ref 32.0–36.0)
MCV: 99 fL (ref 80–100)
Monocyte #: 0.8 10*3/uL — ABNORMAL HIGH (ref 0.0–0.7)
Monocyte %: 11.4 %
Platelet: 195 10*3/uL (ref 150–440)
RBC: 3.75 10*6/uL — ABNORMAL LOW (ref 3.80–5.20)
RDW: 15.4 % — ABNORMAL HIGH (ref 11.5–14.5)
WBC: 7 10*3/uL (ref 3.6–11.0)

## 2011-08-04 LAB — BASIC METABOLIC PANEL
Anion Gap: 11 (ref 7–16)
BUN: 15 mg/dL (ref 7–18)
Calcium, Total: 8.3 mg/dL — ABNORMAL LOW (ref 8.5–10.1)
Co2: 24 mmol/L (ref 21–32)
EGFR (African American): >60
EGFR (Non-African Amer.): >60
Glucose: 111 mg/dL — ABNORMAL HIGH (ref 65–99)
Osmolality: 281 (ref 275–301)
Potassium: 4.1 mmol/L (ref 3.5–5.1)

## 2011-08-15 ENCOUNTER — Telehealth: Payer: Self-pay | Admitting: Internal Medicine

## 2011-08-16 ENCOUNTER — Ambulatory Visit: Payer: Self-pay | Admitting: Internal Medicine

## 2011-08-16 NOTE — Telephone Encounter (Signed)
Pt's nephew is asking what you think about pt's recent hospitalization, tests and treatments.  He would like your opinion on what her prognosis is.

## 2011-08-17 NOTE — Telephone Encounter (Signed)
She has metastatic cancer,  As I had suspected, Most likely from the breast cancer that she deferred treatment on 2 years ago when it recurred.  I think she has  a prognosis of less than one year and would opt for placement in a facility that can provide Hospice care when appropriate.  I understand that she is not a candidate now for Hospice

## 2011-08-18 ENCOUNTER — Encounter: Payer: Self-pay | Admitting: Internal Medicine

## 2011-08-18 NOTE — Telephone Encounter (Signed)
Advised Randa Ngo.

## 2011-08-18 NOTE — Telephone Encounter (Signed)
Left message to return my call.  

## 2011-08-24 ENCOUNTER — Encounter: Payer: Self-pay | Admitting: Internal Medicine

## 2011-09-05 ENCOUNTER — Encounter: Payer: Self-pay | Admitting: Internal Medicine

## 2011-09-26 DEATH — deceased

## 2011-09-27 ENCOUNTER — Encounter: Payer: Self-pay | Admitting: *Deleted

## 2011-10-14 ENCOUNTER — Ambulatory Visit: Payer: 59 | Admitting: Internal Medicine

## 2012-02-18 IMAGING — US ABDOMEN ULTRASOUND
1 series · 17 of 25 positions shown · non-contrast
Comparison: none

REASON FOR EXAM: weight loss and elevated ALK Phos
COMMENTS:

[Series 1: abdomen ultrasound · 17 of 125 slices shown]
[im 1/125]
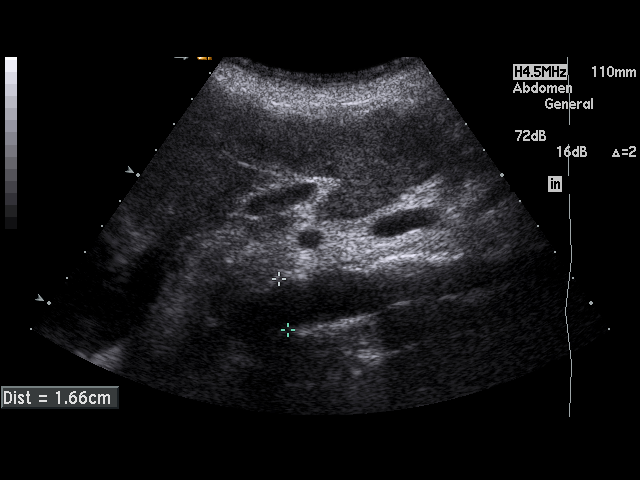
[im 11/125]
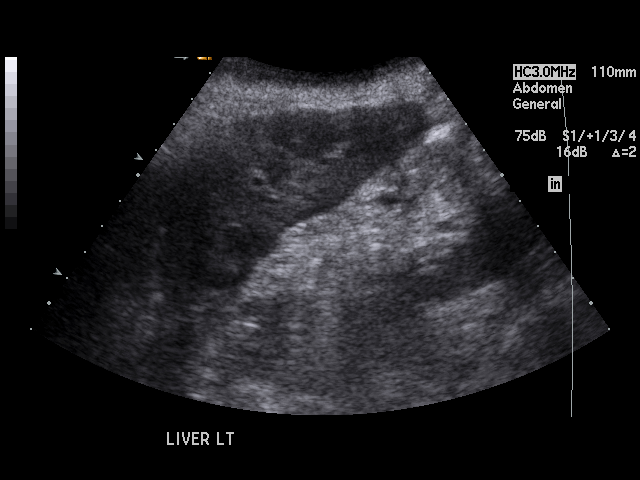
[im 16/125]
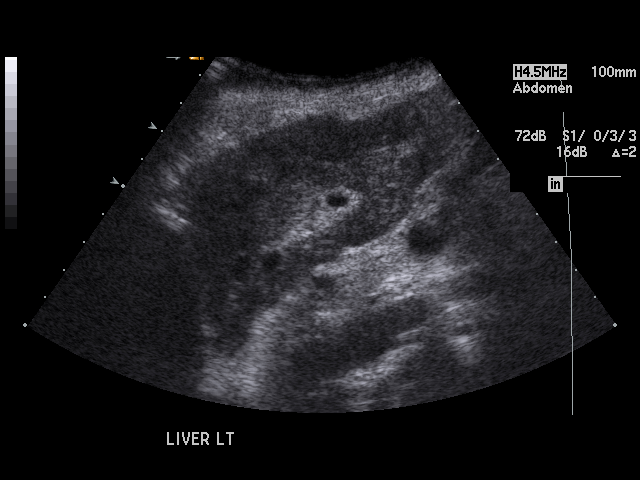
[im 26/125]
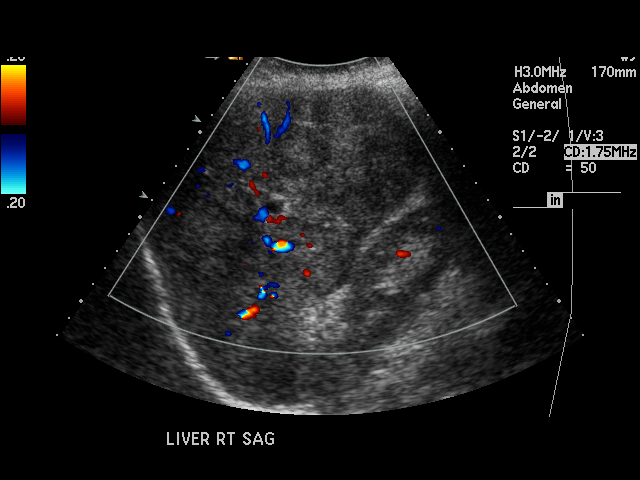
[im 32/125]
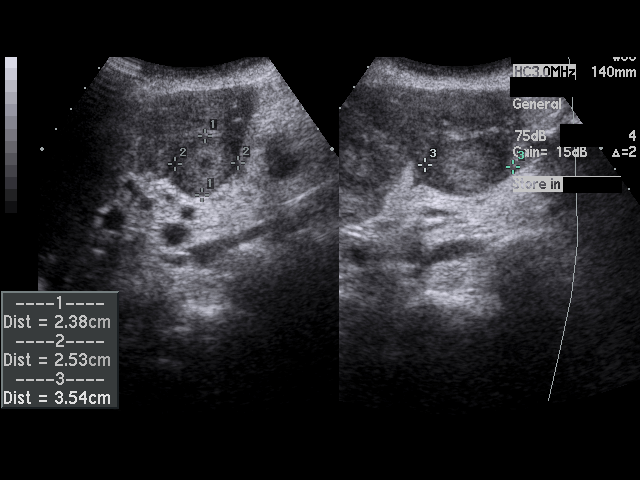
[im 42/125]
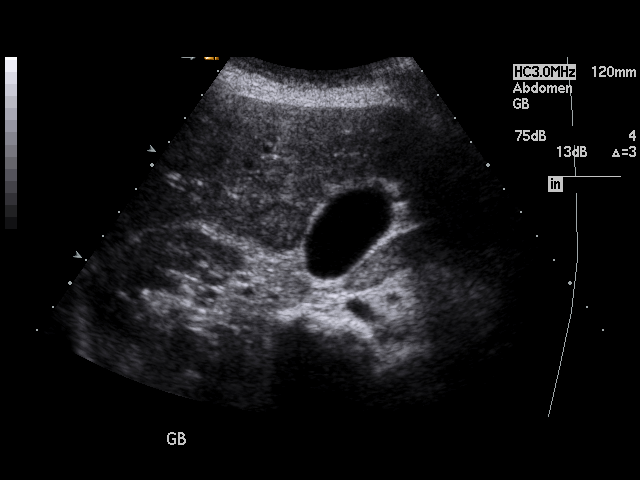
[im 47/125]
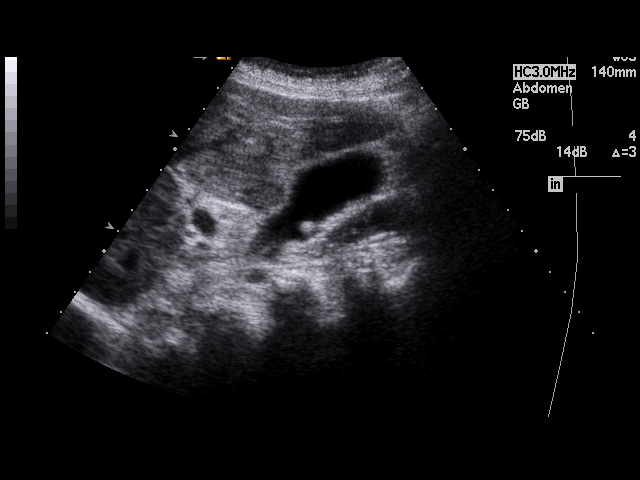
[im 57/125]
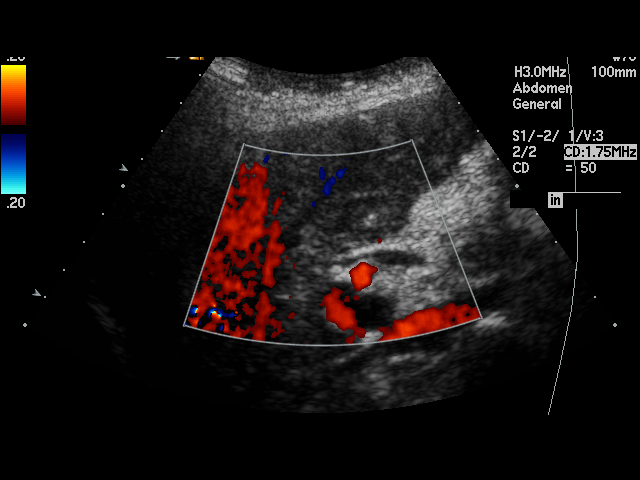
[im 63/125]
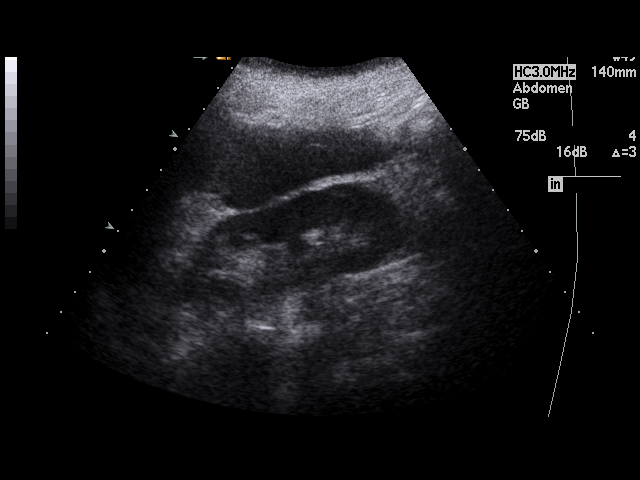
[im 68/125]
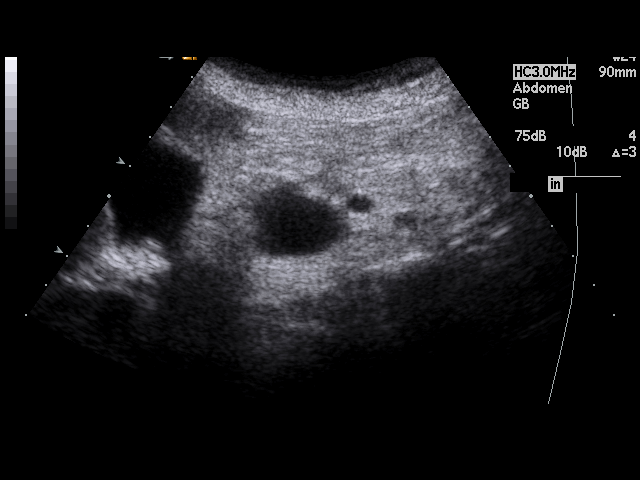
[im 78/125]
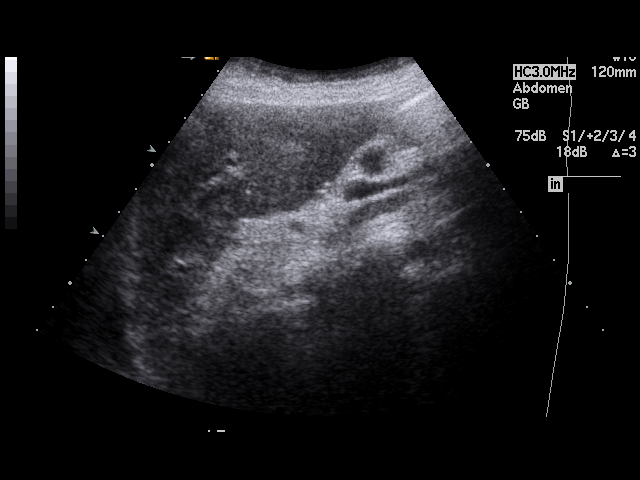
[im 83/125]
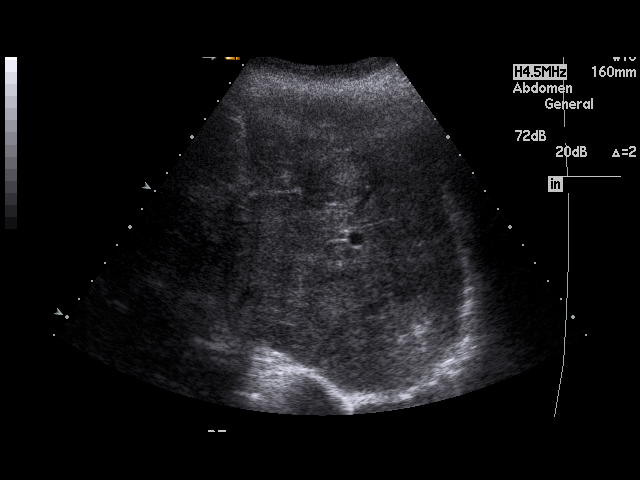
[im 94/125]
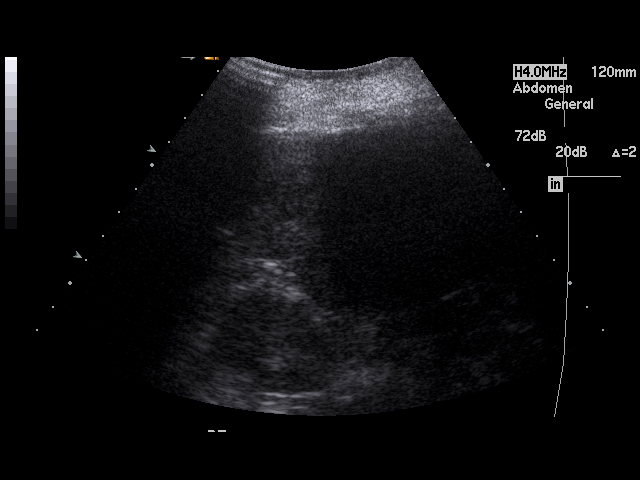
[im 99/125]
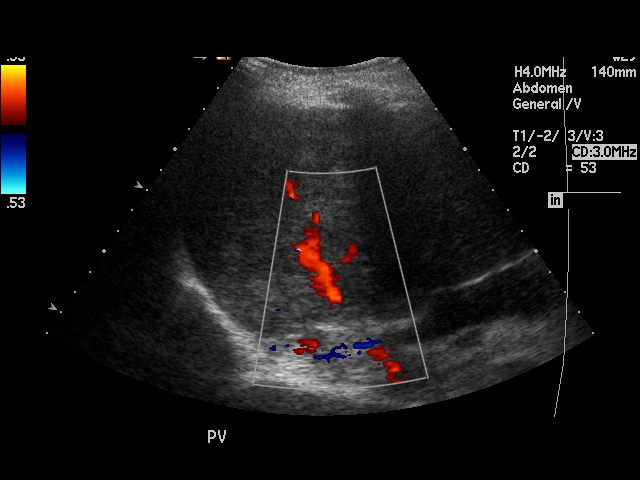
[im 109/125]
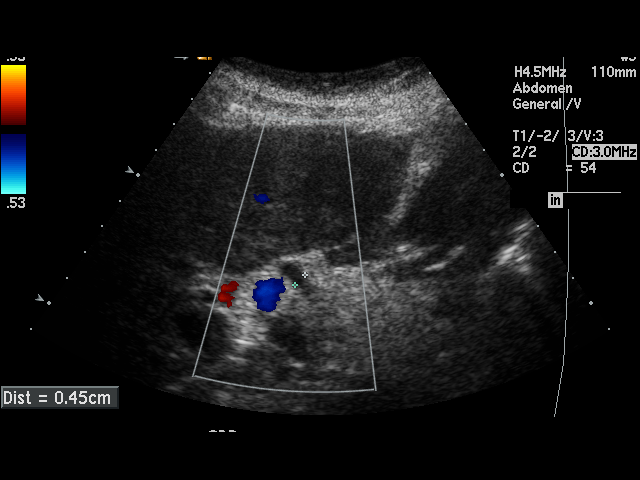
[im 114/125]
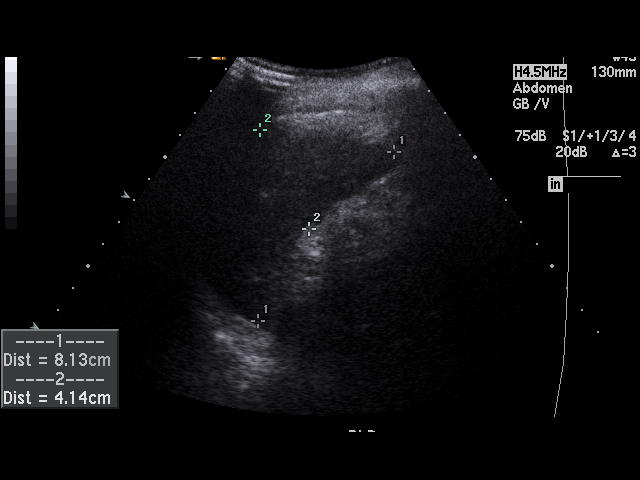
[im 125/125]
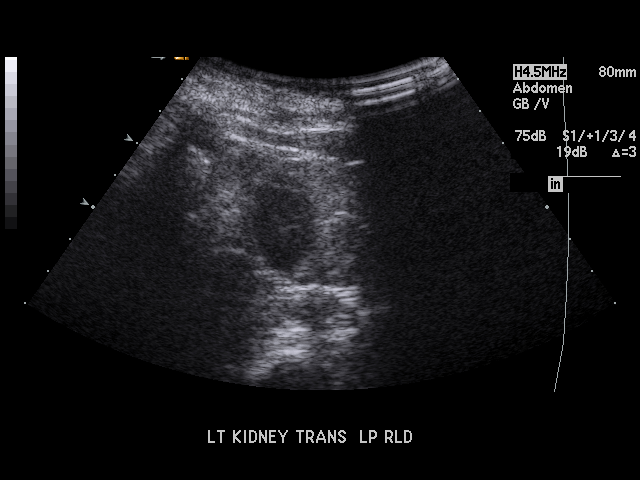

[17 of 25 positions shown; findings below may reference images not displayed]

PROCEDURE:     US  - US ABDOMEN GENERAL SURVEY  - July 25, 2011 [DATE]

RESULT:     Sonographic interrogation of the abdomen is performed. The aorta
is normal in caliber without evidence of aneurysm. There is a 1.02 x
mass in the region of the body to head of the pancreas is hypoechoic. Second
mass is seen in the junction of the body and tail measuring 0.91 x 0.66 cm.
The liver contains multiple areas of mixed echotexture consistent with
diffuse metastatic involvement. Findings are concerning for metastatic
disease to the liver. The lesions in the pancreas could be primary or
metastatic lesions. Nonshadowing density in the gallbladder suggest multiple
mobile gallstones. The right and left kidneys appear normal. The spleen is
unremarkable. The inferior vena cava is patent. No ascites is evident. Some
punctate areas of calcification appear present in the right lobe of the
liver area
IMPRESSION: 1. Multiple masses within the pancreas and liver. Correlate for underlying
malignancy possibly of pancreatic origin and metastatic disease to the liver
although given the history of breast cancer the possibility of metastatic
breast cancer is not completely excluded in the liver. Previous CT of the
abdomen showed cystic-appearing mass in the pancreatic head. Followup with
CT of the chest abdomen and pelvis is recommended along with oncologic
consultation.(*)

## 2012-02-24 IMAGING — CR DG CHEST 2V
1 series · 2 of 2 positions shown · non-contrast
Comparison: none

REASON FOR EXAM: weakness
COMMENTS:

PROCEDURE:     DXR - DXR CHEST PA (OR AP) AND LATERAL  - July 31, 2011  [DATE]
RESULT:     Comparison: None

[Series 1: ap · 0.17mm/px · 2 of 2 slices shown]
[im 1/2]
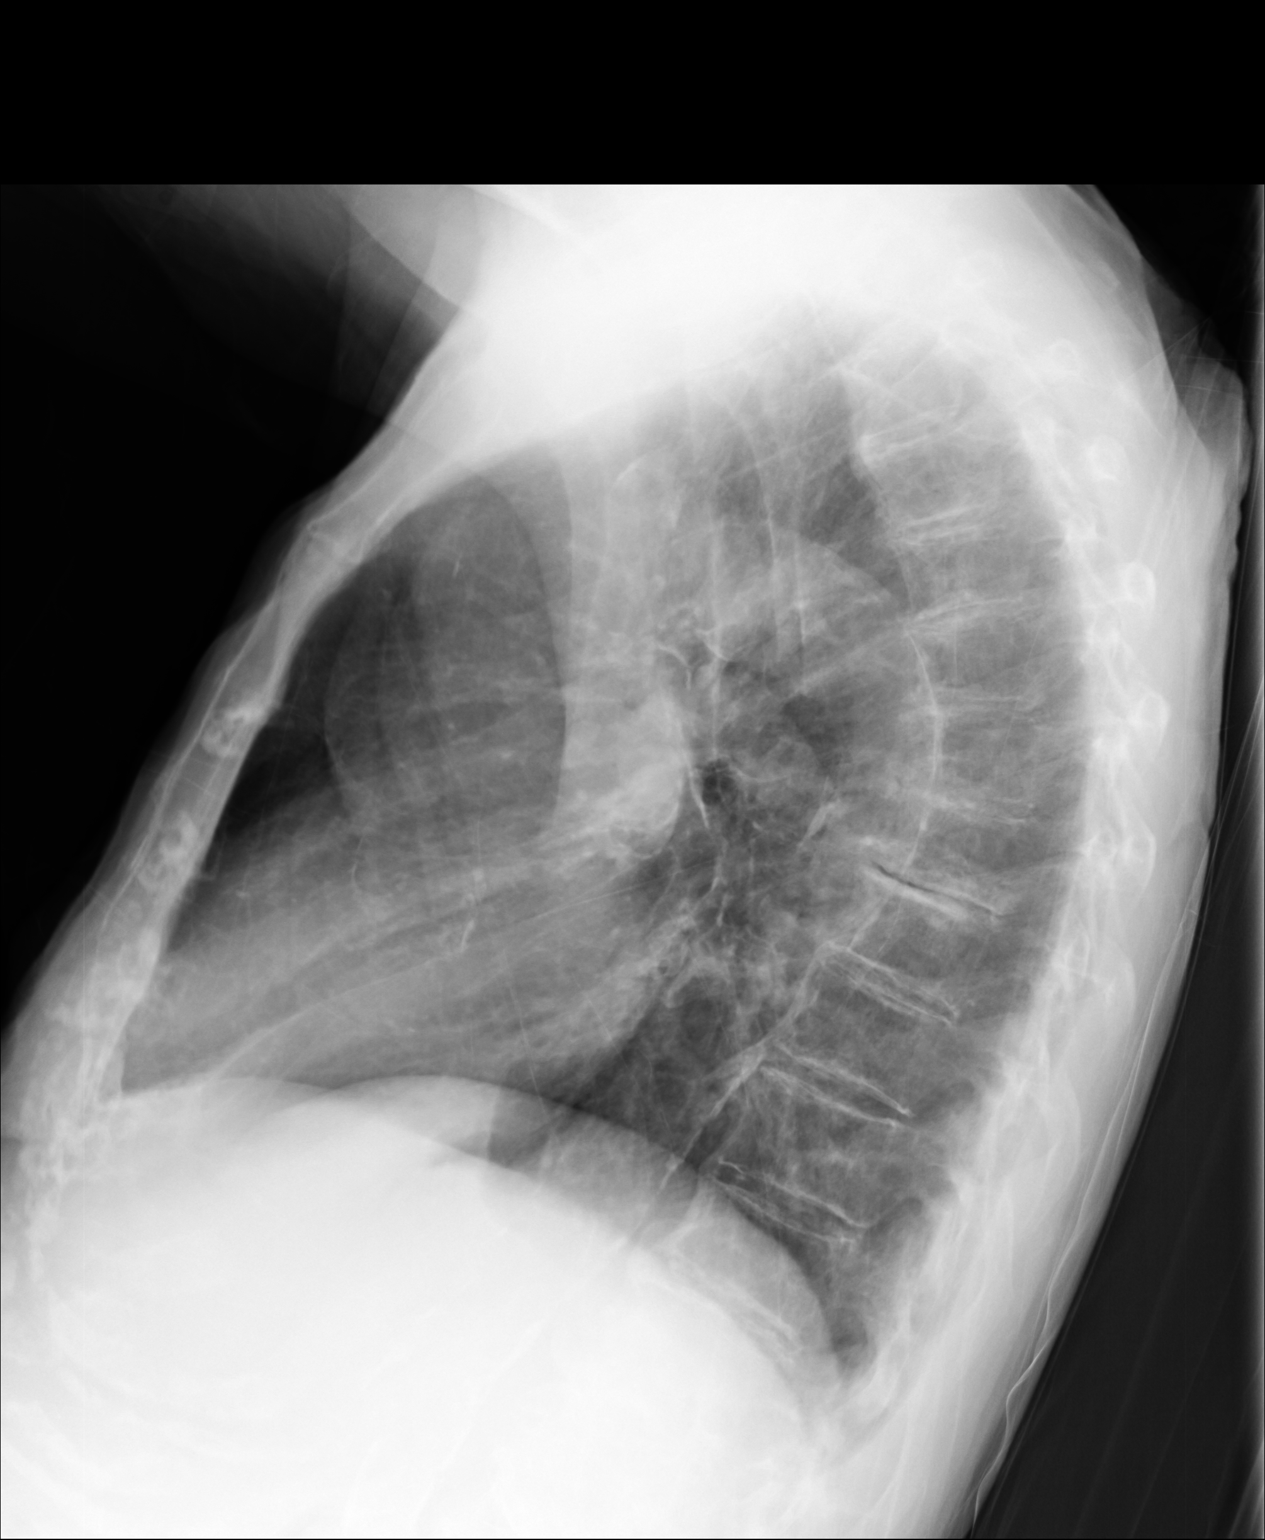
[im 2/2]
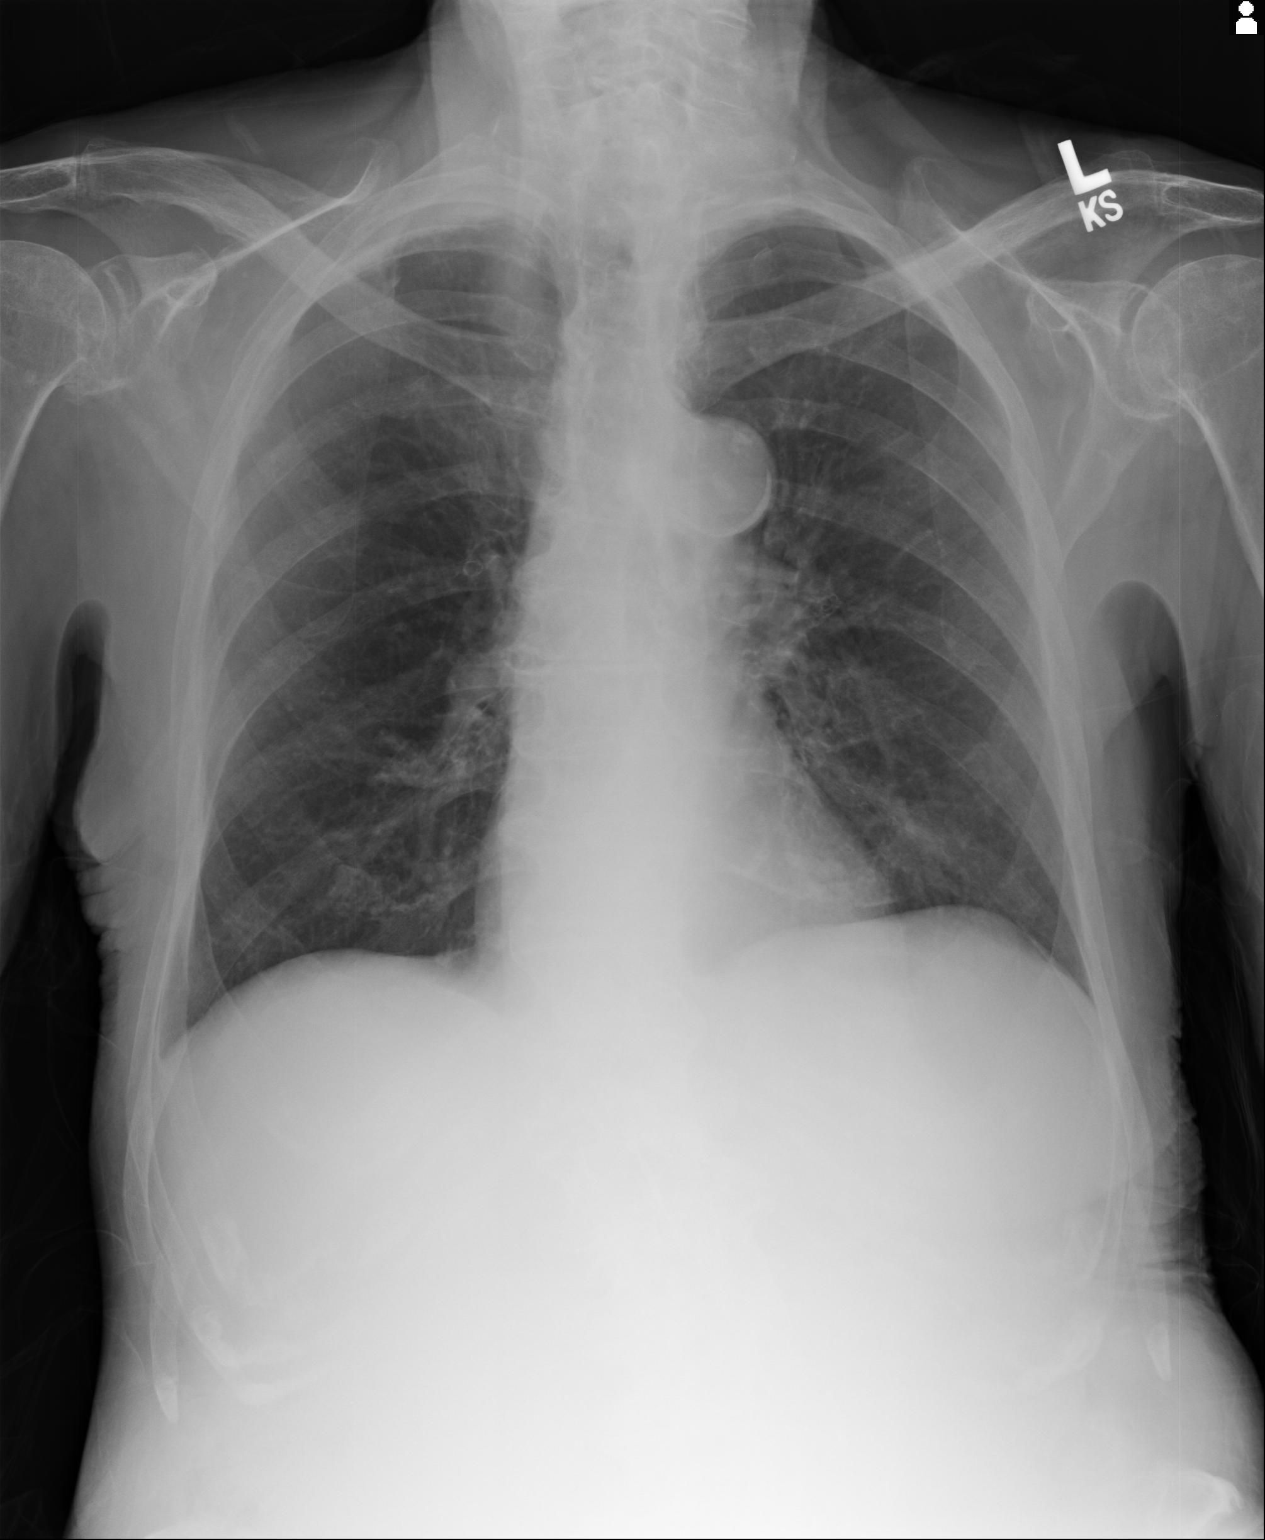

[2 of 2 positions shown; findings below may reference images not displayed]

FINDINGS: PA and lateral chest radiographs are provided.  There is no focal
parenchymal opacity, pleural effusion, or pneumothorax. The heart and
mediastinum are unremarkable.  The osseous structures are unremarkable.
IMPRESSION: No acute disease of the chest.

## 2012-04-02 ENCOUNTER — Encounter: Payer: Self-pay | Admitting: Internal Medicine

## 2012-08-11 ENCOUNTER — Other Ambulatory Visit: Payer: Self-pay

## 2013-01-30 ENCOUNTER — Other Ambulatory Visit: Payer: Self-pay

## 2013-05-02 ENCOUNTER — Other Ambulatory Visit: Payer: Self-pay

## 2014-10-19 NOTE — Discharge Summary (Signed)
PATIENT NAME:  Sophia Rice, [place] MR#:  [id number] DATE OF BIRTH:  08/14/1923  DATE OF ADMISSION:  07/31/2011 DATE OF DISCHARGE:  08/04/2011  PRIMARY CARE PHYSICIAN: Deborra Medina, MD  FINAL DIAGNOSES:  1. Encephalopathy.  2. Urinary tract infection.  3. Suspected underlying dementia.  4. Metastatic breast cancer to the liver, bone, ribs, and spine.   5. Hypothyroidism.  6. Left foot wound.  7. History of diabetes.   DISCHARGE MEDICATIONS: 1. Keflex 500 mg p.o. three times daily for three more days then stop.  2. Protonix 40 mg p.o. daily.  3. Synthroid 50 mcg p.o. daily.   DIET: Low sodium, 1800 ADA diet, puree with thin liquids, encourage eating with all meals, and may need assistance with all meals.   ACTIVITY: As tolerated.   REFERRAL: Physical therapy. Hospice after rehabilitation days used.   DISCHARGE FOLLOWUP: Followup in 1 to 2 days with the doctor at rehab.   REASON FOR ADMISSION: The patient was admitted 07/31/2011 and discharge 08/04/2011. She came in with altered mental status and frequent falls.   HISTORY OF PRESENT ILLNESS: This is an 81 year old female who has been quite independent up to this point. She has been having falls and some bruises, some confusion, and unable to take care of herself at home. In the Emergency Room, she had an ultrasound of the abdomen which showed lesions on the liver. Hospitalist services were contacted for admission. She was admitted for encephalopathy and found to have a urinary tract infection and to be dehydrated, and a liver lesion.   LABS/STUDIES: Chest x-ray was negative.   EKG: Normal sinus rhythm.   Urinalysis: Positive for 3+ leukocyte esterase.   Urine culture grew out Escherichia coli pansensitive.  TSH 5.05. Glucose 140, BUN 30, creatinine 0.54, sodium 146, potassium 4.1, chloride 107, CO2 29, calcium 8.2, albumin 3. White blood cell count 12.7, hemoglobin and hematocrit 12.5 and 37.8, and platelet count 191.   CT scan  of the head, with and without contrast, showed no metastatic disease to the brain.   CT scan of the chest, abdomen, and pelvis showed new small bilateral pleural effusions, emphysematous changes in the lungs, bony lesions in the spine and ribs worrisome for metastatic disease, and extensive abnormalities within the liver worrisome for metastatic disease.   Glucose upon discharge was 110. White blood cell count was 7.0, hemoglobin 12.3, and platelets 195.   HOSPITAL COURSE PER PROBLEM LIST:  1. For the patient's encephalopathy, this has improved with treatment of the urinary tract infection and IV fluid hydration. I do suspect it is underlying dementia.  2. Urinary tract infection: This was treated with Rocephin and switched over to Keflex upon discharge.  3. I suspect underlying dementia. I do not think she understands what I am trying to tell her completely. I spoke with Levada Dy and power of attorney, Maudie Mercury.  4. For metastatic breast cancer, the patient has been without treatment for 2-1/2 years with Dr. Inez Pilgrim. At that time, she was on hormonal therapy, developed a recurrence, and had radiation treatment. She had refused chemotherapy at that time, so chemotherapy again was deferred at this time. She has new lesions of the bone and liver. Hospice saw the patient. She is not a candidate currently for the hospice home, but will be followed after rehab days used up. If any decline in status, can be transferred over to the hospice home.  5. For hypothyroidism, on Synthroid 50 mcg daily.  6. For the left foot wound,  please place calcium alginate gel and a thin dressing over it. Heel protectors while in bed.  7. For history of diabetes, fingersticks are acceptable here. No medications upon discharge.  TIME SPENT ON DISCHARGE: 40 minutes.  ____________________________ Tana Conch. Leslye Peer, MD rjw:slb D: 08/04/2011 09:40:43 ET T: 08/04/2011 09:47:58 ET JOB#: [id number]  cc: Tana Conch. Leslye Peer, MD,  <Dictator> Deborra Medina, MD Marisue Brooklyn MD ELECTRONICALLY SIGNED 08/13/2011 16:45

## 2014-10-19 NOTE — H&P (Signed)
PATIENT NAME:  Sophia Rice, Sophia Rice MR#:  [id number] DATE OF BIRTH:  [birth year]  DATE OF ADMISSION:  07/31/2011  PRIMARY CARE PHYSICIAN: Dr. Deborra Medina   CHIEF COMPLAINT: Altered mental status and frequent falls.   HISTORY OF PRESENT ILLNESS: Sophia Rice is an 81 year old pleasant Caucasian female who lives at home alone and was quite independent. Her family noticed that lately that she is falling and sustained some bruises on her back and elbow. However, recently she was found to have altered mental status with some confusion associated with lethargy. The patient was transported by ambulance to the Emergency Department for further evaluation. I spoke with her relative. He states that Sophia Rice is his aunt and she reached a level where she cannot take care of herself and she is falling and her mental status is not the same. He tells me that in the last few days she was evaluated by her primary care physician and had an ultrasound of the abdomen which showed lesions in the liver and pancreas suspicious for recurrence of breast cancer with metastasis. This is at the level of suspicion but not diagnosed yet. She is scheduled as an outpatient to have CT scan of the abdomen.   REVIEW OF SYSTEMS: CONSTITUTIONAL: Patient denies having any fever. No chills. No night sweats. She denies everything although it was documented that she has generalized fatigue. EYES: Denies any blurring of vision. No double vision. ENT: No hearing impairment. No sore throat. No dysphagia. CARDIOVASCULAR: Denies having any chest pain. No shortness of breath. No cough. No syncope. RESPIRATORY: Denies any shortness of breath. No cough. No sputum production. GASTROINTESTINAL: Denies any abdominal pain. No vomiting, no diarrhea. GENITOURINARY: No dysuria or frequency of urination despite the finding of significant urinary tract infection. MUSCULOSKELETAL: No joint pain or swelling. INTEGUMENTARY: The patient has skin bruises and ulcer  on the left foot. NEUROLOGY: No focal weakness. Denies any seizure activity. No headache. PSYCHIATRY: No history of anxiety or depression. ENDOCRINE: No night sweats. No heat or cold intolerance although adequate information is not precise due to her altered mental status. HEMATOLOGIC: There is hematoma on the right buttock area and right elbow, appears to be traumatic from her falls. No lymph node enlargement.   PAST MEDICAL HISTORY:  1. History of breast cancer in 2005 with recurrence in 2010. She is status post mastectomy.  2. History of systemic hypertension.  3. History of diabetes mellitus, type 2.  4. History of hypothyroidism.  5. History of hypercholesterolemia.   SOCIAL HABITS: Nonsmoker. There is a remote history of smoking according to her relative. No alcoholism. No other drug abuse.   SOCIAL HISTORY: She is single, never married, lives at home alone.   FAMILY HISTORY: Her relative reports no significant family history of any cancer or any disease, however, she has a sister who had aortic aneurysm.   ADMISSION MEDICATIONS: What we know through the relative that she is on thyroid medicine but the dose is not specified. She is on vitamin D supplementation once a week, however, they have suspicion that she was taking it every day rather than once a week. Her old records showed that she was on other medications like Lipitor, glyburide and Diovan, however, according to the relatives they say that she is not taking them anymore.   ALLERGIES: No known drug allergies.   PHYSICAL EXAMINATION:  VITAL SIGNS: Blood pressure 134/63, pulse 93 per minute, respiratory rate 20, oxygen saturation was 99% on room air, temperature 98.2.  GENERAL APPEARANCE: Elderly lady thin looking, laying on the stretcher in no acute distress.   HEAD AND NECK EXAMINATION: No pallor. No icterus. No cyanosis. ENT: Hearing was normal. Nasal mucosa, lips, tongue were normal apart from dry mucous membranes. Eye  examination revealed normal eyelids and conjunctivae. Pupils about 4 to 5 mm, equal and sluggishly reactive to light.   NECK: Supple. Trachea at midline. No thyromegaly. No cervical lymphadenopathy. No masses.   HEART: Normal S1, S2. There is systolic murmur, grade 3/6 at the apex and left sternal border. No carotid bruits.   RESPIRATORY: Normal breathing pattern without use of accessory muscles. No rales. No wheezing.   ABDOMEN: Soft without tenderness. No hepatosplenomegaly. No masses. No hernias.   MUSCULOSKELETAL: No joint swelling. No clubbing.   SKIN: Subcutaneous hematoma on the right elbow area and right buttock as well. Left foot ulcer which looks clean with surrounding callus formation. There are other callus skin lesions on both feet, mostly on the right than the left. There is extensive fungal infection in the form of onychomycosis in both and feet.   NEUROLOGIC: Cranial nerves II through XII are intact. No focal motor deficit. Plantar responses were zero or no upgoing or downgoing toes.   PSYCHIATRY: Patient is alert, oriented to people and the place being the hospital but she could not know the name of the hospital. She does not know the date but she knows the year being 2013. She could not tell who was the President of the [place].    PAST SURGICAL HISTORY:  1. History of appendectomy. 2. History of mastectomy.   LABORATORY, DIAGNOSTIC AND RADIOLOGICAL DATA: EKG showed normal sinus rhythm at rate of 86 per minute, unremarkable EKG. Urinalysis showed cloudy urine, +3 leukocyte esterase, more than 300 white blood cells, +3 bacteria. The rest of her laboratory findings are pending.   ASSESSMENT:  1. Mental status change, etiology are multifactorial. This could be secondary to her underlying urinary tract infection and also dehydration, however, there are suspicions of lesions in the liver and pancreas, possibly metastasis therefore central nervous system metastasis has to  be considered as part of the work-up.  2. Urinary tract infection.  3. Dehydration. Patient is clinically dehydrated. I am awaiting the results of her BUN, creatinine and electrolytes.  4. New finding recently of liver and pancreatic lesions. She is awaiting outpatient CAT scan that was already scheduled by her primary care physician.  5. History of breast cancer with recent suspicions of possible recurrence.  6. History of systemic hypertension.  7. History of diabetes mellitus type 2.  8. History of hypothyroidism.  9. History of hypercholesterolemia.  10. Diabetic foot ulcer and callus formation which seems reasonably dealt with and treated by home health nurse.   PLAN: Admit the patient to the medical floor. I would like to obtain CAT scan of the head before being admitted to the medical floor to rule out metastasis. IV antibiotic to cover for urinary tract infection utilizing Rocephin 1 gram daily. I would like to check her TSH since her intake of medication is questionable. I would like to check her electrolytes. I would like to put her on peptic ulcer disease prophylaxis using Protonix 40 mg p.o. daily. Deep vein thrombosis prophylaxis with Lovenox 40 mg subcutaneous once a day unless there is a finding of elevated creatinine. If there is renal failure I will reduce the dose. I will consult the social services to consider placement. This was raised by the  family and their feeling that for a long time she was resisting that but it came time to consider that at this point and she will not be able to provide care for herself.   I reviewed her old records and discussed the case with the nurse practitioner in the Emergency Department who saw the patient first. I discussed with the family her CODE STATUS at the time being she is FULL CODE.   TIME NEEDED TO EVALUATE THIS PATIENT: Took more than 50 minutes.   ____________________________ Clovis Pu. Lenore Manner, MD amd:cms D: 07/31/2011 16:33:20  ET T: 08/01/2011 05:53:51 ET JOB#: [id number]  cc: Clovis Pu. Lenore Manner, MD, <Dictator> Deborra Medina, MD  Mike Craze Irven Coe MD ELECTRONICALLY SIGNED 08/04/2011 22:31
